# Patient Record
Sex: Female | Born: 1955
Health system: Southern US, Community
[De-identification: ages and names within clinical notes are randomized; demographics above are authoritative.]

## PROBLEM LIST (undated history)

## (undated) HISTORY — PX: ABDOMINAL HYSTERECTOMY: SHX81

---

## 2004-08-11 ENCOUNTER — Ambulatory Visit (HOSPITAL_COMMUNITY): Admission: RE | Admit: 2004-08-11 | Discharge: 2004-08-11 | Payer: Self-pay | Admitting: Emergency Medicine

## 2005-10-02 ENCOUNTER — Ambulatory Visit (HOSPITAL_COMMUNITY): Admission: RE | Admit: 2005-10-02 | Discharge: 2005-10-02 | Payer: Self-pay | Admitting: Emergency Medicine

## 2006-10-24 ENCOUNTER — Ambulatory Visit (HOSPITAL_COMMUNITY): Admission: RE | Admit: 2006-10-24 | Discharge: 2006-10-24 | Payer: Self-pay | Admitting: Family Medicine

## 2008-05-10 ENCOUNTER — Ambulatory Visit (HOSPITAL_COMMUNITY): Admission: RE | Admit: 2008-05-10 | Discharge: 2008-05-10 | Payer: Self-pay | Admitting: Family Medicine

## 2013-11-16 ENCOUNTER — Other Ambulatory Visit (HOSPITAL_COMMUNITY): Payer: Self-pay | Admitting: Pediatrics

## 2013-11-16 DIAGNOSIS — Z139 Encounter for screening, unspecified: Secondary | ICD-10-CM

## 2013-11-17 ENCOUNTER — Ambulatory Visit (HOSPITAL_COMMUNITY)
Admission: RE | Admit: 2013-11-17 | Discharge: 2013-11-17 | Disposition: A | Discharge status: Home / Self Care | Payer: 59 | Source: Ambulatory Visit | Attending: Pediatrics | Admitting: Pediatrics

## 2013-11-17 DIAGNOSIS — Z1231 Encounter for screening mammogram for malignant neoplasm of breast: Secondary | ICD-10-CM | POA: Insufficient documentation

## 2013-11-17 DIAGNOSIS — Z139 Encounter for screening, unspecified: Secondary | ICD-10-CM

## 2014-12-15 IMAGING — MG MM DIGITAL SCREENING
4 series · 4 of 4 positions shown · non-contrast
Comparison: Previous exam(s).

CLINICAL DATA: Screening.

EXAM:
DIGITAL SCREENING BILATERAL MAMMOGRAM WITH CAD

[L CC]
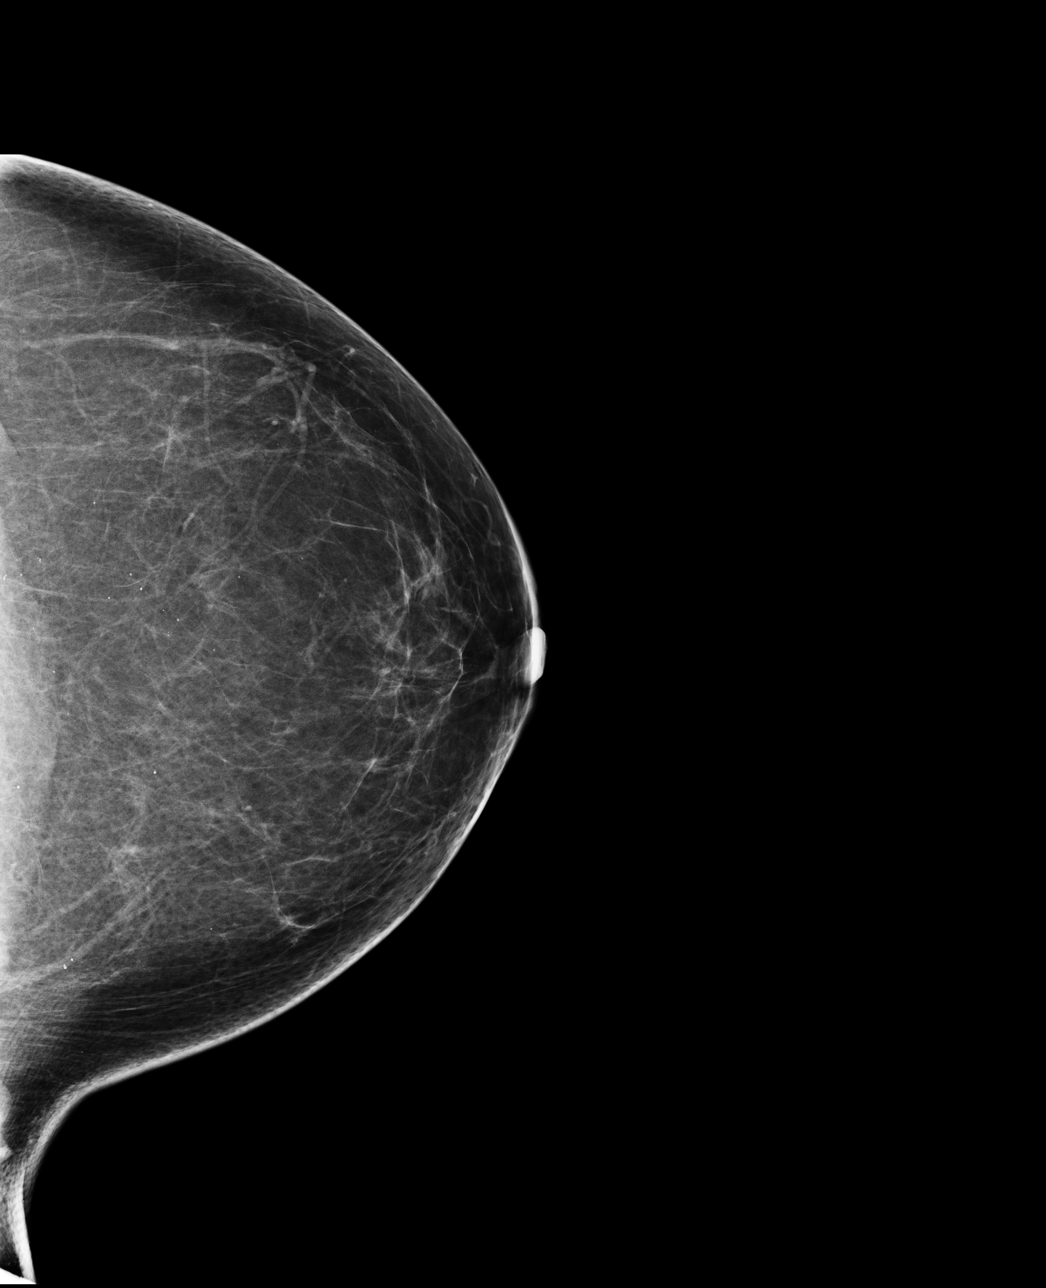

[L MLO]
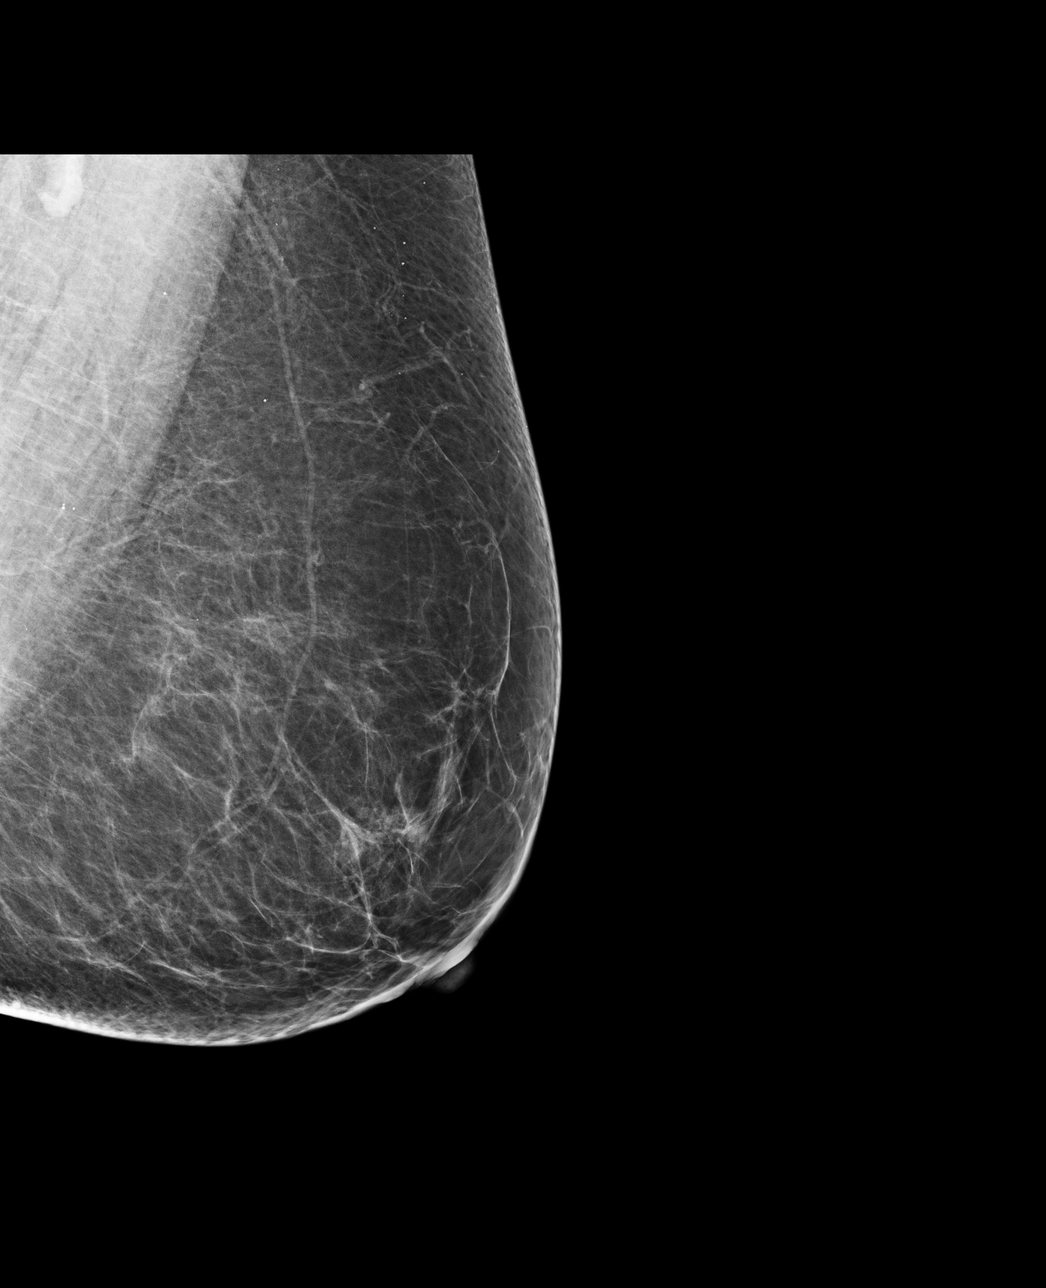

[R CC]
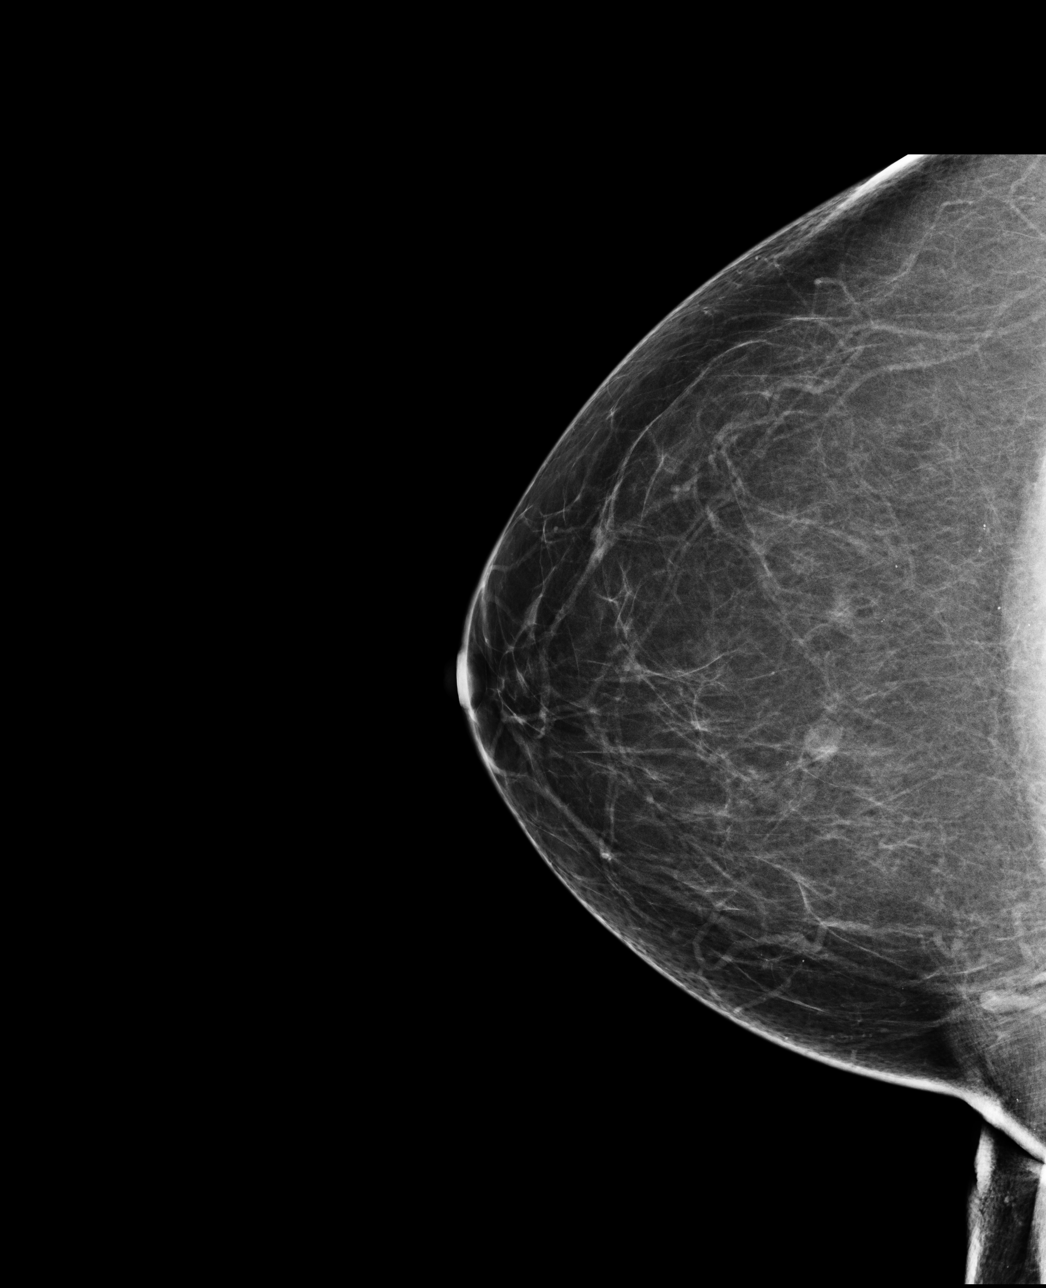

[R MLO]
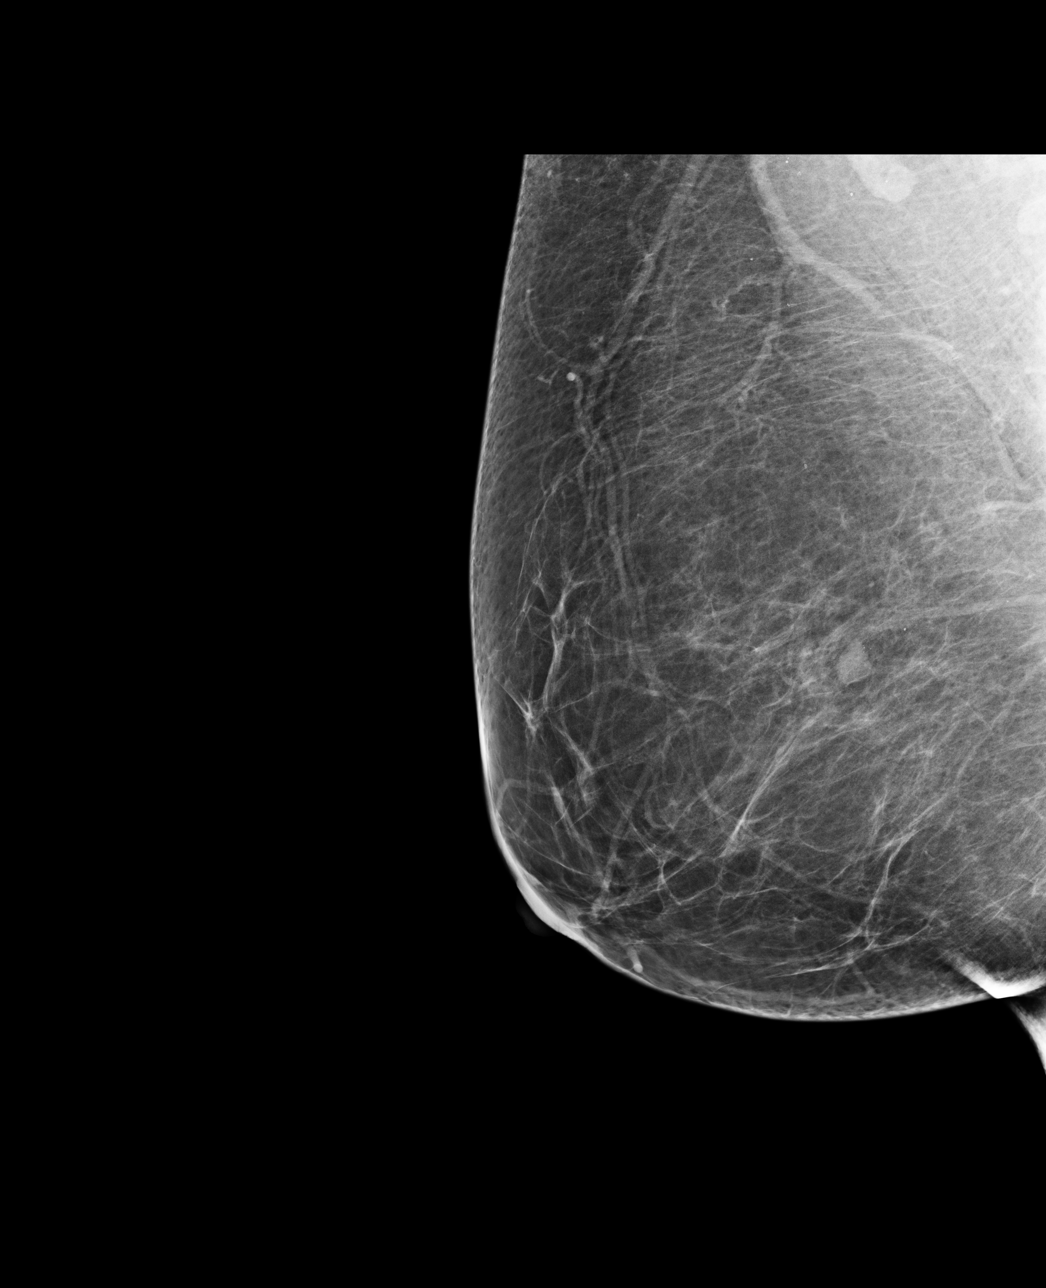

[4 of 4 positions shown; findings below may reference images not displayed]

ACR Breast Density Category b: There are scattered areas of
fibroglandular density.
FINDINGS: There are no findings suspicious for malignancy. Images were
processed with CAD.
IMPRESSION: No mammographic evidence of malignancy. A result letter of this
screening mammogram will be mailed directly to the patient.

RECOMMENDATION:
Screening mammogram in one year. (Code:AS-G-LCT)

BI-RADS CATEGORY  1: Negative.

## 2016-04-02 DIAGNOSIS — Z0100 Encounter for examination of eyes and vision without abnormal findings: Secondary | ICD-10-CM | POA: Diagnosis not present

## 2016-09-13 ENCOUNTER — Emergency Department
Admission: EM | Admit: 2016-09-13 | Discharge: 2016-09-13 | Disposition: A | Discharge status: Home / Self Care | Payer: 59 | Source: Home / Self Care | Attending: Family Medicine | Admitting: Family Medicine

## 2016-09-13 ENCOUNTER — Encounter: Payer: Self-pay | Admitting: *Deleted

## 2016-09-13 DIAGNOSIS — R300 Dysuria: Secondary | ICD-10-CM | POA: Diagnosis not present

## 2016-09-13 DIAGNOSIS — N3090 Cystitis, unspecified without hematuria: Secondary | ICD-10-CM

## 2016-09-13 LAB — POCT URINALYSIS DIP (MANUAL ENTRY)
Bilirubin, UA: NEGATIVE
Blood, UA: NEGATIVE
Glucose, UA: NEGATIVE
Ketones, POC UA: NEGATIVE
Leukocytes, UA: NEGATIVE
Nitrite, UA: NEGATIVE
Protein Ur, POC: NEGATIVE
Spec Grav, UA: 1.01 (ref 1.005–1.03)
Urobilinogen, UA: 0.2 (ref 0–1)
pH, UA: 7 (ref 5–8)

## 2016-09-13 MED ORDER — NITROFURANTOIN MONOHYD MACRO 100 MG PO CAPS: 100.0000 mg | ORAL_CAPSULE | Freq: Two times a day (BID) | ORAL | 0 refills | Status: AC

## 2016-09-13 NOTE — Discharge Instructions (Signed)
Increase fluid intake. May use non-prescription AZO for about two days, if desired, to decrease urinary discomfort.  

## 2016-09-13 NOTE — ED Triage Notes (Signed)
Pt c/o 3 days of dysuria, polyuria and bladder pressure.Afebrile, no otc meds taken.

## 2016-09-13 NOTE — ED Provider Notes (Signed)
[facility]    CSN: [id number] Arrival date & time: 09/13/16  1253     History   Chief Complaint Chief Complaint  Patient presents with  . Dysuria  . bladder pressure    HPI Maria Nguyen is a 60 y.o. female.   Patient complains of 3 to 4 day history of dysuria, urgency, nocturia, and lower abdominal pressure.   The history is provided by the patient.  Dysuria  Pain quality:  Burning Pain severity:  Mild Onset quality:  Gradual Duration:  3 days Timing:  Constant Progression:  Worsening Chronicity:  New Recent urinary tract infections: no   Relieved by:  Nothing Worsened by:  Nothing Ineffective treatments:  None tried Urinary symptoms: frequent urination and hesitancy   Urinary symptoms: no discolored urine, no foul-smelling urine, no hematuria and no bladder incontinence   Associated symptoms: no abdominal pain, no fever, no flank pain, no genital lesions, no nausea, no vaginal discharge and no vomiting     No past medical history on file.  There are no active problems to display for this patient.   Past Surgical History:  Procedure Laterality Date  . ABDOMINAL HYSTERECTOMY      OB History    No data available       Home Medications    Prior to Admission medications   Medication Sig Start Date End Date Taking? Authorizing Provider  aspirin EC 81 MG tablet Take 81 mg by mouth daily.   Yes Historical Provider, MD  nitrofurantoin, macrocrystal-monohydrate, (MACROBID) 100 MG capsule Take 1 capsule (100 mg total) by mouth 2 (two) times daily. Take with food. 09/13/16   Kandra Nicolas, MD    Family History No family history on file.  Social History Social History  Substance Use Topics  . Smoking status: Not on file  . Smokeless tobacco: Not on file  . Alcohol use Not on file     Allergies   Patient has no known allergies.   Review of Systems Review of Systems  Constitutional: Negative for fever.  Gastrointestinal:  Negative for abdominal pain, nausea and vomiting.  Genitourinary: Positive for dysuria. Negative for flank pain and vaginal discharge.     Physical Exam Triage Vital Signs ED Triage Vitals  Enc Vitals Group     BP 09/13/16 1323 115/71     Pulse Rate 09/13/16 1323 100     Resp --      Temp 09/13/16 1323 98.1 F (36.7 C)     Temp Source 09/13/16 1323 Oral     SpO2 09/13/16 1323 96 %     Weight 09/13/16 1323 188 lb (85.3 kg)     Height --      Head Circumference --      Peak Flow --      Pain Score 09/13/16 1324 0     Pain Loc --      Pain Edu? --      Excl. in [place]? --    No data found.   Updated Vital Signs BP 115/71 (BP Location: Left Arm)   Pulse 100   Temp 98.1 F (36.7 C) (Oral)   Wt 188 lb (85.3 kg)   SpO2 96%   Visual Acuity Right Eye Distance:   Left Eye Distance:   Bilateral Distance:    Right Eye Near:   Left Eye Near:    Bilateral Near:     Physical Exam Nursing notes and Vital Signs reviewed. Appearance:  Patient appears  stated age, and in no acute distress.    Eyes:  Pupils are equal, round, and reactive to light and accomodation.  Extraocular movement is intact.  Conjunctivae are not inflamed   Pharynx:  Normal; moist mucous membranes  Neck:  Supple.  No adenopathy Lungs:  Clear to auscultation.  Breath sounds are equal.  Moving air well. Heart:  Regular rate and rhythm without murmurs, rubs, or gallops.  Abdomen:  Nontender without masses or hepatosplenomegaly.  Bowel sounds are present.  No CVA or flank tenderness.  Extremities:  No edema.  Skin:  No rash present.     UC Treatments / Results  Labs (all labs ordered are listed, but only abnormal results are displayed) Labs Reviewed  URINE CULTURE   Narrative:    Performed at:  [facility], [id number]                [place], [place] [address]  POCT URINALYSIS DIP (MANUAL ENTRY) negative    EKG  EKG Interpretation None       Radiology No results  found.  Procedures Procedures (including critical care time)  Medications Ordered in UC Medications - No data to display   Initial Impression / Assessment and Plan / UC Course  I have reviewed the triage vital signs and the nursing notes.  Pertinent labs & imaging results that were available during my care of the patient were reviewed by me and considered in my medical decision making (see chart for details).  Clinical Course   Urine culture pending. Begin Macrobid 100mg  BID for one week. Increase fluid intake. If symptoms become significantly worse during the night or over the weekend, proceed to the local emergency room.  Followup with Family Doctor if not improved in one week.      Final Clinical Impressions(s) / UC Diagnoses   Final diagnoses:  Dysuria  Cystitis    New Prescriptions Discharge Medication List as of 09/13/2016  1:55 PM    START taking these medications   Details  nitrofurantoin, macrocrystal-monohydrate, (MACROBID) 100 MG capsule Take 1 capsule (100 mg total) by mouth 2 (two) times daily. Take with food., Starting Thu 09/13/2016, Print         Kandra Nicolas, MD 09/20/16 2118

## 2016-09-14 LAB — URINE CULTURE

## 2016-09-15 ENCOUNTER — Telehealth: Payer: Self-pay | Admitting: Emergency Medicine

## 2016-09-15 NOTE — Telephone Encounter (Signed)
Pt informed of results.  [place]

## 2018-01-21 DIAGNOSIS — Z1231 Encounter for screening mammogram for malignant neoplasm of breast: Secondary | ICD-10-CM | POA: Diagnosis not present

## 2018-01-21 DIAGNOSIS — Z131 Encounter for screening for diabetes mellitus: Secondary | ICD-10-CM | POA: Diagnosis not present

## 2018-01-21 DIAGNOSIS — Z13220 Encounter for screening for lipoid disorders: Secondary | ICD-10-CM | POA: Diagnosis not present

## 2018-01-21 DIAGNOSIS — Z23 Encounter for immunization: Secondary | ICD-10-CM | POA: Diagnosis not present

## 2018-01-21 DIAGNOSIS — Z0000 Encounter for general adult medical examination without abnormal findings: Secondary | ICD-10-CM | POA: Diagnosis not present

## 2018-01-22 ENCOUNTER — Other Ambulatory Visit (HOSPITAL_COMMUNITY): Payer: Self-pay | Admitting: Physician Assistant

## 2018-01-22 DIAGNOSIS — Z1231 Encounter for screening mammogram for malignant neoplasm of breast: Secondary | ICD-10-CM

## 2018-01-29 DIAGNOSIS — Z124 Encounter for screening for malignant neoplasm of cervix: Secondary | ICD-10-CM | POA: Diagnosis not present

## 2018-01-29 DIAGNOSIS — Z8639 Personal history of other endocrine, nutritional and metabolic disease: Secondary | ICD-10-CM | POA: Diagnosis not present

## 2018-01-29 DIAGNOSIS — E559 Vitamin D deficiency, unspecified: Secondary | ICD-10-CM | POA: Diagnosis not present

## 2018-01-29 DIAGNOSIS — Z0000 Encounter for general adult medical examination without abnormal findings: Secondary | ICD-10-CM | POA: Diagnosis not present

## 2018-01-29 DIAGNOSIS — R5383 Other fatigue: Secondary | ICD-10-CM | POA: Diagnosis not present

## 2018-02-20 ENCOUNTER — Encounter (HOSPITAL_COMMUNITY): Payer: Self-pay

## 2018-02-20 ENCOUNTER — Ambulatory Visit (HOSPITAL_COMMUNITY)
Admission: RE | Admit: 2018-02-20 | Discharge: 2018-02-20 | Disposition: A | Discharge status: Home / Self Care | Payer: 59 | Source: Ambulatory Visit | Attending: Physician Assistant | Admitting: Physician Assistant

## 2018-02-20 DIAGNOSIS — Z1231 Encounter for screening mammogram for malignant neoplasm of breast: Secondary | ICD-10-CM | POA: Insufficient documentation

## 2019-01-09 DIAGNOSIS — Z0100 Encounter for examination of eyes and vision without abnormal findings: Secondary | ICD-10-CM | POA: Diagnosis not present

## 2019-01-26 DIAGNOSIS — Z13220 Encounter for screening for lipoid disorders: Secondary | ICD-10-CM | POA: Diagnosis not present

## 2019-01-26 DIAGNOSIS — Z0000 Encounter for general adult medical examination without abnormal findings: Secondary | ICD-10-CM | POA: Diagnosis not present

## 2019-01-26 DIAGNOSIS — Z131 Encounter for screening for diabetes mellitus: Secondary | ICD-10-CM | POA: Diagnosis not present

## 2019-01-26 DIAGNOSIS — Z1239 Encounter for other screening for malignant neoplasm of breast: Secondary | ICD-10-CM | POA: Diagnosis not present

## 2019-02-12 DIAGNOSIS — L810 Postinflammatory hyperpigmentation: Secondary | ICD-10-CM | POA: Diagnosis not present

## 2019-05-14 DIAGNOSIS — L219 Seborrheic dermatitis, unspecified: Secondary | ICD-10-CM | POA: Diagnosis not present

## 2019-05-14 DIAGNOSIS — L821 Other seborrheic keratosis: Secondary | ICD-10-CM | POA: Diagnosis not present

## 2019-05-14 DIAGNOSIS — L570 Actinic keratosis: Secondary | ICD-10-CM | POA: Diagnosis not present

## 2019-05-14 DIAGNOSIS — D1801 Hemangioma of skin and subcutaneous tissue: Secondary | ICD-10-CM | POA: Diagnosis not present

## 2019-05-14 DIAGNOSIS — D2230 Melanocytic nevi of unspecified part of face: Secondary | ICD-10-CM | POA: Diagnosis not present

## 2019-05-14 DIAGNOSIS — L814 Other melanin hyperpigmentation: Secondary | ICD-10-CM | POA: Diagnosis not present

## 2019-05-14 DIAGNOSIS — D239 Other benign neoplasm of skin, unspecified: Secondary | ICD-10-CM | POA: Diagnosis not present

## 2019-05-14 DIAGNOSIS — D225 Melanocytic nevi of trunk: Secondary | ICD-10-CM | POA: Diagnosis not present

## 2019-07-14 ENCOUNTER — Other Ambulatory Visit (HOSPITAL_COMMUNITY): Payer: Self-pay | Admitting: Physician Assistant

## 2019-07-14 DIAGNOSIS — Z1231 Encounter for screening mammogram for malignant neoplasm of breast: Secondary | ICD-10-CM

## 2019-07-27 ENCOUNTER — Ambulatory Visit (HOSPITAL_COMMUNITY)
Admission: RE | Admit: 2019-07-27 | Discharge: 2019-07-27 | Disposition: A | Discharge status: Home / Self Care | Payer: 59 | Source: Ambulatory Visit | Attending: Physician Assistant | Admitting: Physician Assistant

## 2019-07-27 ENCOUNTER — Other Ambulatory Visit: Payer: Self-pay

## 2019-07-27 DIAGNOSIS — Z1231 Encounter for screening mammogram for malignant neoplasm of breast: Secondary | ICD-10-CM | POA: Insufficient documentation

## 2020-02-01 DIAGNOSIS — Z0000 Encounter for general adult medical examination without abnormal findings: Secondary | ICD-10-CM | POA: Diagnosis not present

## 2020-02-01 DIAGNOSIS — E559 Vitamin D deficiency, unspecified: Secondary | ICD-10-CM | POA: Diagnosis not present

## 2020-02-01 DIAGNOSIS — Z13220 Encounter for screening for lipoid disorders: Secondary | ICD-10-CM | POA: Diagnosis not present

## 2020-02-01 DIAGNOSIS — Z131 Encounter for screening for diabetes mellitus: Secondary | ICD-10-CM | POA: Diagnosis not present

## 2020-03-01 DIAGNOSIS — Z1159 Encounter for screening for other viral diseases: Secondary | ICD-10-CM | POA: Diagnosis not present

## 2020-03-30 DIAGNOSIS — Z1159 Encounter for screening for other viral diseases: Secondary | ICD-10-CM | POA: Diagnosis not present

## 2020-04-04 DIAGNOSIS — Z1211 Encounter for screening for malignant neoplasm of colon: Secondary | ICD-10-CM | POA: Diagnosis not present

## 2020-04-04 DIAGNOSIS — D122 Benign neoplasm of ascending colon: Secondary | ICD-10-CM | POA: Diagnosis not present

## 2020-04-04 DIAGNOSIS — D123 Benign neoplasm of transverse colon: Secondary | ICD-10-CM | POA: Diagnosis not present

## 2020-04-04 DIAGNOSIS — Z800 Family history of malignant neoplasm of digestive organs: Secondary | ICD-10-CM | POA: Diagnosis not present

## 2020-05-09 DIAGNOSIS — L814 Other melanin hyperpigmentation: Secondary | ICD-10-CM | POA: Diagnosis not present

## 2020-05-09 DIAGNOSIS — L723 Sebaceous cyst: Secondary | ICD-10-CM | POA: Diagnosis not present

## 2020-05-09 DIAGNOSIS — L578 Other skin changes due to chronic exposure to nonionizing radiation: Secondary | ICD-10-CM | POA: Diagnosis not present

## 2020-05-09 DIAGNOSIS — L570 Actinic keratosis: Secondary | ICD-10-CM | POA: Diagnosis not present

## 2020-05-09 DIAGNOSIS — L821 Other seborrheic keratosis: Secondary | ICD-10-CM | POA: Diagnosis not present

## 2020-05-09 DIAGNOSIS — D225 Melanocytic nevi of trunk: Secondary | ICD-10-CM | POA: Diagnosis not present

## 2020-05-09 DIAGNOSIS — D239 Other benign neoplasm of skin, unspecified: Secondary | ICD-10-CM | POA: Diagnosis not present

## 2020-09-07 ENCOUNTER — Other Ambulatory Visit (HOSPITAL_COMMUNITY): Payer: Self-pay | Admitting: Family Medicine

## 2020-09-07 DIAGNOSIS — Z1231 Encounter for screening mammogram for malignant neoplasm of breast: Secondary | ICD-10-CM

## 2020-10-26 ENCOUNTER — Encounter (HOSPITAL_COMMUNITY): Payer: Self-pay

## 2020-10-26 ENCOUNTER — Other Ambulatory Visit: Payer: Self-pay

## 2020-10-26 ENCOUNTER — Ambulatory Visit (HOSPITAL_COMMUNITY)
Admission: RE | Admit: 2020-10-26 | Discharge: 2020-10-26 | Disposition: A | Discharge status: Home / Self Care | Payer: 59 | Source: Ambulatory Visit | Attending: Family Medicine | Admitting: Family Medicine

## 2020-10-26 DIAGNOSIS — Z1231 Encounter for screening mammogram for malignant neoplasm of breast: Secondary | ICD-10-CM | POA: Insufficient documentation

## 2020-11-04 DIAGNOSIS — Z0100 Encounter for examination of eyes and vision without abnormal findings: Secondary | ICD-10-CM | POA: Diagnosis not present

## 2020-12-04 DIAGNOSIS — Z23 Encounter for immunization: Secondary | ICD-10-CM | POA: Diagnosis not present

## 2021-02-09 DIAGNOSIS — I10 Essential (primary) hypertension: Secondary | ICD-10-CM | POA: Diagnosis not present

## 2021-02-09 DIAGNOSIS — E559 Vitamin D deficiency, unspecified: Secondary | ICD-10-CM | POA: Diagnosis not present

## 2021-02-09 DIAGNOSIS — Z131 Encounter for screening for diabetes mellitus: Secondary | ICD-10-CM | POA: Diagnosis not present

## 2021-02-09 DIAGNOSIS — Z0000 Encounter for general adult medical examination without abnormal findings: Secondary | ICD-10-CM | POA: Diagnosis not present

## 2021-02-09 DIAGNOSIS — E785 Hyperlipidemia, unspecified: Secondary | ICD-10-CM | POA: Diagnosis not present

## 2021-03-10 ENCOUNTER — Other Ambulatory Visit (HOSPITAL_BASED_OUTPATIENT_CLINIC_OR_DEPARTMENT_OTHER): Payer: Self-pay

## 2021-03-10 MED ORDER — LISINOPRIL 10 MG PO TABS
ORAL_TABLET | ORAL | 2 refills | Status: DC
  Filled 2021-03-10: qty 90, 90d supply, fill #0
  Filled 2021-06-01: qty 60, 60d supply, fill #1

## 2021-03-10 MED ORDER — SIMVASTATIN 20 MG PO TABS
ORAL_TABLET | ORAL | 2 refills | Status: DC
  Filled 2021-03-10: qty 90, 90d supply, fill #0
  Filled 2021-06-01: qty 60, 60d supply, fill #1

## 2021-05-15 ENCOUNTER — Encounter: Payer: Self-pay | Admitting: Physician Assistant

## 2021-05-15 ENCOUNTER — Telehealth: Payer: 59 | Admitting: Physician Assistant

## 2021-05-15 DIAGNOSIS — U071 COVID-19: Secondary | ICD-10-CM | POA: Diagnosis not present

## 2021-05-15 MED ORDER — MOLNUPIRAVIR EUA 200MG CAPSULE: 4.0000 | ORAL_CAPSULE | Freq: Two times a day (BID) | ORAL | 0 refills | Status: AC

## 2021-05-15 MED ORDER — BENZONATATE 100 MG PO CAPS: 100.0000 mg | ORAL_CAPSULE | Freq: Three times a day (TID) | ORAL | 0 refills | Status: AC | PRN

## 2021-05-15 NOTE — Progress Notes (Signed)
Maria Nguyen are scheduled for a virtual visit with your provider today.    Just as we do with appointments in the office, we must obtain your consent to participate.  Your consent will be active for this visit and any virtual visit you may have with one of our providers in the next 365 days.    If you have a MyChart account, I can also send a copy of this consent to you electronically.  All virtual visits are billed to your insurance company just like a traditional visit in the office.  As this is a virtual visit, video technology does not allow for your provider to perform a traditional examination.  This may limit your provider's ability to fully assess your condition.  If your provider identifies any concerns that need to be evaluated in person or the need to arrange testing such as labs, EKG, etc, we will make arrangements to do so.    Although advances in technology are sophisticated, we cannot ensure that it will always work on either your end or our end.  If the connection with a video visit is poor, we may have to switch to a telephone visit.  With either a video or telephone visit, we are not always able to ensure that we have a secure connection.   I need to obtain your verbal consent now.   Are you willing to proceed with your visit today?   Maria Nguyen has provided verbal consent on 05/15/2021 for a virtual visit (video or telephone).   Mar Daring, PA-C 05/15/2021  12:41 PM  Virtual Visit Consent   Hendricks Nguyen, you are scheduled for a virtual visit with a [facility] provider today.     Just as with appointments in the office, your consent must be obtained to participate.  Your consent will be active for this visit and any virtual visit you may have with one of our providers in the next 365 days.     If you have a MyChart account, a copy of this consent can be sent to you electronically.  All virtual visits are billed to your insurance company just like a  traditional visit in the office.    As this is a virtual visit, video technology does not allow for your provider to perform a traditional examination.  This may limit your provider's ability to fully assess your condition.  If your provider identifies any concerns that need to be evaluated in person or the need to arrange testing (such as labs, EKG, etc.), we will make arrangements to do so.     Although advances in technology are sophisticated, we cannot ensure that it will always work on either your end or our end.  If the connection with a video visit is poor, the visit may have to be switched to a telephone visit.  With either a video or telephone visit, we are not always able to ensure that we have a secure connection.     I need to obtain your verbal consent now.   Are you willing to proceed with your visit today?    Maria Nguyen has provided verbal consent on 05/15/2021 for a virtual visit (video or telephone).   Mar Daring, PA-C   Date: 05/15/2021 12:41 PM   Virtual Visit via Video Note   I, Mar Daring, connected with  Maria Nguyen  ([id number], 31-Mar-1956) on 05/15/21 at 12:00 PM EDT by a video-enabled telemedicine application and  verified that I am speaking with the correct person using two identifiers.  Location: Patient: Virtual Visit Location Patient: Home Provider: Virtual Visit Location Provider: Home Office   I discussed the limitations of evaluation and management by telemedicine and the availability of in person appointments. The patient expressed understanding and agreed to proceed.    History of Present Illness: Maria Nguyen is a 65 y.o. who identifies as a female who was assigned female at birth, and is being seen today for Covid 19, this morning.  HPI: URI  This is a new problem. The current episode started yesterday. The problem has been unchanged. There has been no fever. Associated symptoms include congestion, coughing (dry),  headaches, a plugged ear sensation, rhinorrhea, sinus pain and a sore throat (more scratchy). Pertinent negatives include no diarrhea, ear pain, nausea, swollen glands or vomiting. Associated symptoms comments: Myalgias yesterday. Treatments tried: Mucinex, benadryl. The treatment provided mild relief.     Problems: There are no problems to display for this patient.   Allergies: No Known Allergies Medications:  Current Outpatient Medications:    benzonatate (TESSALON) 100 MG capsule, Take 1 capsule (100 mg total) by mouth 3 (three) times daily as needed., Disp: 30 capsule, Rfl: 0   molnupiravir EUA 200 mg CAPS, Take 4 capsules (800 mg total) by mouth 2 (two) times daily for 5 days., Disp: 40 capsule, Rfl: 0   aspirin EC 81 MG tablet, Take 81 mg by mouth daily., Disp: , Rfl:    lisinopril (ZESTRIL) 10 MG tablet, Take 1 tablet by mouth daily, Disp: 90 tablet, Rfl: 2   nitrofurantoin, macrocrystal-monohydrate, (MACROBID) 100 MG capsule, Take 1 capsule (100 mg total) by mouth 2 (two) times daily. Take with food., Disp: 14 capsule, Rfl: 0   simvastatin (ZOCOR) 20 MG tablet, Take 1 tablet by mouth every evening, Disp: 90 tablet, Rfl: 2  Observations/Objective: Patient is well-developed, well-nourished in no acute distress.  Resting comfortably at home.  Head is normocephalic, atraumatic.  No labored breathing. Dry, infrequent cough heard without effecting speech Speech is clear and coherent with logical content.  Patient is alert and oriented at baseline.    Assessment and Plan: 1. COVID-19 - molnupiravir EUA 200 mg CAPS; Take 4 capsules (800 mg total) by mouth 2 (two) times daily for 5 days.  Dispense: 40 capsule; Refill: 0 - benzonatate (TESSALON) 100 MG capsule; Take 1 capsule (100 mg total) by mouth 3 (three) times daily as needed.  Dispense: 30 capsule; Refill: 0 - MyChart COVID-19 home monitoring program; Future - Continue OTC symptomatic management of choice - Will send OTC vitamins  and supplement information through AVS - Molnupiravir prescribed for patient - Tessalon perles sent for cough - Patient enrolled in MyChart symptom monitoring - Push fluids - Rest as needed - Referral to Covid treatment team placed, consult appreciated - Discussed return precautions and when to seek in-person evaluation, sent via AVS as well   Follow Up Instructions: I discussed the assessment and treatment plan with the patient. The patient was provided an opportunity to ask questions and all were answered. The patient agreed with the plan and demonstrated an understanding of the instructions.  A copy of instructions were sent to the patient via MyChart.  The patient was advised to call back or seek an in-person evaluation if the symptoms worsen or if the condition fails to improve as anticipated.  Time:  I spent 12 minutes with the patient via telehealth technology discussing the above problems/concerns.  Mar Daring, PA-C

## 2021-05-15 NOTE — Patient Instructions (Addendum)
Hello Maria Nguyen,  You are being placed in the home monitoring program for COVID-19 (commonly known as Coronavirus).  This is because you are suspected to have the virus or are known to have the virus.  If you are unsure which group you fall into call your clinic.    As part of this program, you'll answer a daily questionnaire in the MyChart mobile app. You'll receive a notification through the MyChart app when the questionnaire is available. When you log in to MyChart, you'll see the tasks in your To Do activity.       Clinicians will see any answers that are concerning and take appropriate steps.  If at any point you are having a medical emergency, call 911.  If otherwise concerned call your clinic instead of coming into the clinic or hospital.  To keep from spreading the disease you should: Stay home and limit contact with other people as much as possible.  Wash your hands frequently. Cover your coughs and sneezes with a tissue, and throw used tissues in the trash.   Clean and disinfect frequently touched surfaces and objects.    Take care of yourself by: Staying home Resting Drinking fluids Take fever-reducing medications (Tylenol/Acetaminophen and Ibuprofen)  For more information on the disease go to the Centers for Disease Control and Prevention website     You are being prescribed MOLNUPIRAVIR for COVID-19 infection.   Please call the pharmacy or go through the drive through vs going inside if you are picking up the mediation yourself to prevent further spread. If prescribed to a [facility] affiliated pharmacy, a pharmacist will bring the medication out to your car.   ADMINISTRATION INSTRUCTIONS: Take with or without food. Swallow the tablets whole. Don't chew, crush, or break the medications because it might not work as well  For each dose of the medication, you should be taking FOUR tablets at one time, TWICE a day   Finish your full five-day course of Molnupiravir even if  you feel better before you're done. Stopping this medication too early can make it less effective to prevent severe illness related to [place].    Molnupiravir is prescribed for YOU ONLY. Don't share it with others, even if they have similar symptoms as you. This medication might not be right for everyone.   Make sure to take steps to protect yourself and others while you're taking this medication in order to get well soon and to prevent others from getting sick with COVID-19.   **If you are of childbearing potential (any gender) - it is advised to not get pregnant while taking this medication and recommended that condoms are used for female partners the next 3 months after taking the medication out of extreme caution    COMMON SIDE EFFECTS: Diarrhea Nausea  Dizziness    If your COVID-19 symptoms get worse, get medical help right away. Call 911 if you experience symptoms such as worsening cough, trouble breathing, chest pain that doesn't go away, confusion, a hard time staying awake, and pale or blue-colored skin. This medication won't prevent all COVID-19 cases from getting worse.  Can take to lessen severity: Vit C 500mg  twice daily Quercertin 250-500mg  twice daily Zinc 75-100mg  daily Melatonin 3-6 mg at bedtime Vit D3 1000-2000 IU daily Aspirin 81 mg daily with food Optional: Famotidine 20mg  daily Also can add tylenol/ibuprofen as needed for fevers and body aches May add Mucinex or Mucinex DM as needed for cough/congestion  10 Things You Can Do to  Manage Your COVID-19 Symptoms at Home If you have possible or confirmed COVID-19 Stay home except to get medical care. Monitor your symptoms carefully. If your symptoms get worse, call your healthcare provider immediately. Get rest and stay hydrated. If you have a medical appointment, call the healthcare provider ahead of time and tell them that you have or may have COVID-19. For medical emergencies, call 911 and notify the dispatch  personnel that you have or may have COVID-19. Cover your cough and sneezes with a tissue or use the inside of your elbow. Wash your hands often with soap and water for at least 20 seconds or clean your hands with an alcohol-based hand sanitizer that contains at least 60% alcohol. As much as possible, stay in a specific room and away from other people in your home. Also, you should use a separate bathroom, if available. If you need to be around other people in or outside of the home, wear a mask. Avoid sharing personal items with other people in your household, like dishes, towels, and bedding. Clean all surfaces that are touched often, like counters, tabletops, and doorknobs. Use household cleaning sprays or wipes according to the label instructions. [email or web address] 05/13/2020 This information is not intended to replace advice given to you by your health care provider. Make sure you discuss any questions you have with your healthcare provider. Document Revised: 12/02/2020 Document Reviewed: 12/02/2020 Elsevier Patient Education  [place].

## 2021-05-29 DIAGNOSIS — I10 Essential (primary) hypertension: Secondary | ICD-10-CM | POA: Diagnosis not present

## 2021-05-29 DIAGNOSIS — E785 Hyperlipidemia, unspecified: Secondary | ICD-10-CM | POA: Diagnosis not present

## 2021-05-29 DIAGNOSIS — Z131 Encounter for screening for diabetes mellitus: Secondary | ICD-10-CM | POA: Diagnosis not present

## 2021-05-29 DIAGNOSIS — Z0000 Encounter for general adult medical examination without abnormal findings: Secondary | ICD-10-CM | POA: Diagnosis not present

## 2021-05-29 DIAGNOSIS — E559 Vitamin D deficiency, unspecified: Secondary | ICD-10-CM | POA: Diagnosis not present

## 2021-06-01 ENCOUNTER — Other Ambulatory Visit (HOSPITAL_BASED_OUTPATIENT_CLINIC_OR_DEPARTMENT_OTHER): Payer: Self-pay

## 2021-06-05 DIAGNOSIS — L814 Other melanin hyperpigmentation: Secondary | ICD-10-CM | POA: Diagnosis not present

## 2021-06-05 DIAGNOSIS — D225 Melanocytic nevi of trunk: Secondary | ICD-10-CM | POA: Diagnosis not present

## 2021-06-05 DIAGNOSIS — L821 Other seborrheic keratosis: Secondary | ICD-10-CM | POA: Diagnosis not present

## 2021-06-05 DIAGNOSIS — D239 Other benign neoplasm of skin, unspecified: Secondary | ICD-10-CM | POA: Diagnosis not present

## 2021-06-05 DIAGNOSIS — L578 Other skin changes due to chronic exposure to nonionizing radiation: Secondary | ICD-10-CM | POA: Diagnosis not present

## 2021-08-03 ENCOUNTER — Other Ambulatory Visit (HOSPITAL_BASED_OUTPATIENT_CLINIC_OR_DEPARTMENT_OTHER): Payer: Self-pay

## 2021-08-03 MED ORDER — SIMVASTATIN 20 MG PO TABS
ORAL_TABLET | ORAL | 1 refills | Status: DC
  Filled 2021-08-03: qty 90, 90d supply, fill #0
  Filled 2021-11-06: qty 90, 90d supply, fill #1

## 2021-08-03 MED ORDER — LISINOPRIL 10 MG PO TABS
ORAL_TABLET | ORAL | 1 refills | Status: DC
  Filled 2021-08-03: qty 90, 90d supply, fill #0
  Filled 2021-11-06: qty 90, 90d supply, fill #1

## 2021-08-29 DIAGNOSIS — Z23 Encounter for immunization: Secondary | ICD-10-CM | POA: Diagnosis not present

## 2021-11-06 ENCOUNTER — Other Ambulatory Visit (HOSPITAL_BASED_OUTPATIENT_CLINIC_OR_DEPARTMENT_OTHER): Payer: Self-pay

## 2021-11-06 ENCOUNTER — Other Ambulatory Visit (HOSPITAL_BASED_OUTPATIENT_CLINIC_OR_DEPARTMENT_OTHER): Payer: Self-pay | Admitting: Family Medicine

## 2021-11-06 DIAGNOSIS — Z1231 Encounter for screening mammogram for malignant neoplasm of breast: Secondary | ICD-10-CM

## 2021-11-08 ENCOUNTER — Other Ambulatory Visit (HOSPITAL_BASED_OUTPATIENT_CLINIC_OR_DEPARTMENT_OTHER): Payer: Self-pay | Admitting: Family Medicine

## 2021-11-08 ENCOUNTER — Other Ambulatory Visit: Payer: Self-pay

## 2021-11-08 ENCOUNTER — Encounter (HOSPITAL_BASED_OUTPATIENT_CLINIC_OR_DEPARTMENT_OTHER): Payer: Self-pay

## 2021-11-08 ENCOUNTER — Ambulatory Visit (HOSPITAL_BASED_OUTPATIENT_CLINIC_OR_DEPARTMENT_OTHER)
Admission: RE | Admit: 2021-11-08 | Discharge: 2021-11-08 | Disposition: A | Discharge status: Home / Self Care | Payer: 59 | Source: Ambulatory Visit | Attending: Family Medicine | Admitting: Family Medicine

## 2021-11-08 DIAGNOSIS — Z1231 Encounter for screening mammogram for malignant neoplasm of breast: Secondary | ICD-10-CM | POA: Diagnosis not present

## 2021-12-19 DIAGNOSIS — Z0100 Encounter for examination of eyes and vision without abnormal findings: Secondary | ICD-10-CM | POA: Diagnosis not present

## 2022-01-30 ENCOUNTER — Other Ambulatory Visit (HOSPITAL_BASED_OUTPATIENT_CLINIC_OR_DEPARTMENT_OTHER): Payer: Self-pay

## 2022-01-30 MED ORDER — LISINOPRIL 10 MG PO TABS
ORAL_TABLET | ORAL | 1 refills | Status: AC
  Filled 2022-01-30: qty 90, 90d supply, fill #0

## 2022-01-30 MED ORDER — SIMVASTATIN 20 MG PO TABS
ORAL_TABLET | ORAL | 1 refills | Status: DC
  Filled 2022-01-30: qty 90, 90d supply, fill #0
  Filled 2022-05-02: qty 90, 90d supply, fill #1

## 2022-02-09 ENCOUNTER — Other Ambulatory Visit (HOSPITAL_BASED_OUTPATIENT_CLINIC_OR_DEPARTMENT_OTHER): Payer: Self-pay

## 2022-02-09 DIAGNOSIS — I10 Essential (primary) hypertension: Secondary | ICD-10-CM | POA: Diagnosis not present

## 2022-02-09 DIAGNOSIS — T783XXA Angioneurotic edema, initial encounter: Secondary | ICD-10-CM | POA: Diagnosis not present

## 2022-02-12 ENCOUNTER — Other Ambulatory Visit (HOSPITAL_BASED_OUTPATIENT_CLINIC_OR_DEPARTMENT_OTHER): Payer: Self-pay

## 2022-02-12 MED ORDER — AMLODIPINE BESYLATE 5 MG PO TABS
ORAL_TABLET | ORAL | 3 refills | Status: AC
  Filled 2022-02-12: qty 90, 90d supply, fill #0
  Filled 2022-05-02: qty 90, 90d supply, fill #1

## 2022-02-20 DIAGNOSIS — Z23 Encounter for immunization: Secondary | ICD-10-CM | POA: Diagnosis not present

## 2022-02-20 DIAGNOSIS — Z13820 Encounter for screening for osteoporosis: Secondary | ICD-10-CM | POA: Diagnosis not present

## 2022-02-20 DIAGNOSIS — I10 Essential (primary) hypertension: Secondary | ICD-10-CM | POA: Diagnosis not present

## 2022-02-20 DIAGNOSIS — Z1231 Encounter for screening mammogram for malignant neoplasm of breast: Secondary | ICD-10-CM | POA: Diagnosis not present

## 2022-02-20 DIAGNOSIS — Z124 Encounter for screening for malignant neoplasm of cervix: Secondary | ICD-10-CM | POA: Diagnosis not present

## 2022-02-20 DIAGNOSIS — E785 Hyperlipidemia, unspecified: Secondary | ICD-10-CM | POA: Diagnosis not present

## 2022-02-20 DIAGNOSIS — N3941 Urge incontinence: Secondary | ICD-10-CM | POA: Diagnosis not present

## 2022-02-20 DIAGNOSIS — Z1211 Encounter for screening for malignant neoplasm of colon: Secondary | ICD-10-CM | POA: Diagnosis not present

## 2022-02-20 DIAGNOSIS — Z0000 Encounter for general adult medical examination without abnormal findings: Secondary | ICD-10-CM | POA: Diagnosis not present

## 2022-05-03 ENCOUNTER — Other Ambulatory Visit (HOSPITAL_BASED_OUTPATIENT_CLINIC_OR_DEPARTMENT_OTHER): Payer: Self-pay

## 2022-06-05 DIAGNOSIS — D239 Other benign neoplasm of skin, unspecified: Secondary | ICD-10-CM | POA: Diagnosis not present

## 2022-06-05 DIAGNOSIS — L821 Other seborrheic keratosis: Secondary | ICD-10-CM | POA: Diagnosis not present

## 2022-06-05 DIAGNOSIS — L820 Inflamed seborrheic keratosis: Secondary | ICD-10-CM | POA: Diagnosis not present

## 2022-06-05 DIAGNOSIS — L578 Other skin changes due to chronic exposure to nonionizing radiation: Secondary | ICD-10-CM | POA: Diagnosis not present

## 2022-06-05 DIAGNOSIS — D229 Melanocytic nevi, unspecified: Secondary | ICD-10-CM | POA: Diagnosis not present

## 2022-06-05 DIAGNOSIS — L814 Other melanin hyperpigmentation: Secondary | ICD-10-CM | POA: Diagnosis not present

## 2022-07-30 ENCOUNTER — Other Ambulatory Visit (HOSPITAL_BASED_OUTPATIENT_CLINIC_OR_DEPARTMENT_OTHER): Payer: Self-pay

## 2022-07-30 MED ORDER — AMLODIPINE BESYLATE 5 MG PO TABS
5.0000 mg | ORAL_TABLET | Freq: Every day | ORAL | 2 refills | Status: DC
  Filled 2022-07-30: qty 90, 90d supply, fill #0
  Filled 2022-10-23: qty 90, 90d supply, fill #1
  Filled 2023-01-22: qty 90, 90d supply, fill #2

## 2022-07-30 MED ORDER — SIMVASTATIN 20 MG PO TABS
20.0000 mg | ORAL_TABLET | Freq: Every evening | ORAL | 2 refills | Status: DC
  Filled 2022-07-30: qty 90, 90d supply, fill #0
  Filled 2022-10-23: qty 90, 90d supply, fill #1
  Filled 2023-01-22: qty 90, 90d supply, fill #2

## 2022-08-17 DIAGNOSIS — Z23 Encounter for immunization: Secondary | ICD-10-CM | POA: Diagnosis not present

## 2022-11-26 ENCOUNTER — Other Ambulatory Visit: Payer: Self-pay | Admitting: Family Medicine

## 2022-11-26 DIAGNOSIS — Z1231 Encounter for screening mammogram for malignant neoplasm of breast: Secondary | ICD-10-CM

## 2022-11-28 ENCOUNTER — Ambulatory Visit (INDEPENDENT_AMBULATORY_CARE_PROVIDER_SITE_OTHER): Payer: Commercial Managed Care - PPO

## 2022-11-28 DIAGNOSIS — Z1231 Encounter for screening mammogram for malignant neoplasm of breast: Secondary | ICD-10-CM | POA: Diagnosis not present

## 2023-01-22 ENCOUNTER — Other Ambulatory Visit (HOSPITAL_BASED_OUTPATIENT_CLINIC_OR_DEPARTMENT_OTHER): Payer: Self-pay

## 2023-02-26 ENCOUNTER — Other Ambulatory Visit (HOSPITAL_BASED_OUTPATIENT_CLINIC_OR_DEPARTMENT_OTHER): Payer: Self-pay

## 2023-02-26 DIAGNOSIS — E785 Hyperlipidemia, unspecified: Secondary | ICD-10-CM | POA: Diagnosis not present

## 2023-02-26 DIAGNOSIS — Z13820 Encounter for screening for osteoporosis: Secondary | ICD-10-CM | POA: Diagnosis not present

## 2023-02-26 DIAGNOSIS — Z1211 Encounter for screening for malignant neoplasm of colon: Secondary | ICD-10-CM | POA: Diagnosis not present

## 2023-02-26 DIAGNOSIS — R7301 Impaired fasting glucose: Secondary | ICD-10-CM | POA: Diagnosis not present

## 2023-02-26 DIAGNOSIS — Z1231 Encounter for screening mammogram for malignant neoplasm of breast: Secondary | ICD-10-CM | POA: Diagnosis not present

## 2023-02-26 DIAGNOSIS — Z6838 Body mass index (BMI) 38.0-38.9, adult: Secondary | ICD-10-CM | POA: Diagnosis not present

## 2023-02-26 DIAGNOSIS — I10 Essential (primary) hypertension: Secondary | ICD-10-CM | POA: Diagnosis not present

## 2023-02-26 DIAGNOSIS — Z23 Encounter for immunization: Secondary | ICD-10-CM | POA: Diagnosis not present

## 2023-02-26 DIAGNOSIS — Z7185 Encounter for immunization safety counseling: Secondary | ICD-10-CM | POA: Diagnosis not present

## 2023-02-26 DIAGNOSIS — Z0000 Encounter for general adult medical examination without abnormal findings: Secondary | ICD-10-CM | POA: Diagnosis not present

## 2023-02-26 MED ORDER — SIMVASTATIN 20 MG PO TABS
20.0000 mg | ORAL_TABLET | Freq: Every evening | ORAL | 3 refills | Status: DC
  Filled 2023-02-26 – 2023-04-21 (×3): qty 90, 90d supply, fill #0
  Filled 2023-07-18: qty 90, 90d supply, fill #1
  Filled 2023-10-16: qty 90, 90d supply, fill #2
  Filled 2024-01-14: qty 90, 90d supply, fill #3

## 2023-02-26 MED ORDER — AMLODIPINE BESYLATE 5 MG PO TABS
5.0000 mg | ORAL_TABLET | Freq: Every day | ORAL | 3 refills | Status: DC
  Filled 2023-02-26 – 2023-04-21 (×3): qty 90, 90d supply, fill #0
  Filled 2023-07-18: qty 90, 90d supply, fill #1
  Filled 2023-10-16: qty 90, 90d supply, fill #2
  Filled 2024-01-14: qty 90, 90d supply, fill #3

## 2023-02-27 ENCOUNTER — Other Ambulatory Visit: Payer: Self-pay | Admitting: Family Medicine

## 2023-02-27 DIAGNOSIS — Z13820 Encounter for screening for osteoporosis: Secondary | ICD-10-CM

## 2023-03-02 DIAGNOSIS — Z0100 Encounter for examination of eyes and vision without abnormal findings: Secondary | ICD-10-CM | POA: Diagnosis not present

## 2023-03-27 ENCOUNTER — Other Ambulatory Visit (HOSPITAL_BASED_OUTPATIENT_CLINIC_OR_DEPARTMENT_OTHER): Payer: Self-pay

## 2023-04-21 ENCOUNTER — Other Ambulatory Visit (HOSPITAL_BASED_OUTPATIENT_CLINIC_OR_DEPARTMENT_OTHER): Payer: Self-pay

## 2023-04-24 ENCOUNTER — Other Ambulatory Visit: Payer: Commercial Managed Care - PPO

## 2023-05-15 ENCOUNTER — Ambulatory Visit (INDEPENDENT_AMBULATORY_CARE_PROVIDER_SITE_OTHER): Payer: Commercial Managed Care - PPO

## 2023-05-15 DIAGNOSIS — Z13820 Encounter for screening for osteoporosis: Secondary | ICD-10-CM

## 2023-05-15 DIAGNOSIS — Z780 Asymptomatic menopausal state: Secondary | ICD-10-CM | POA: Diagnosis not present

## 2023-06-06 DIAGNOSIS — L570 Actinic keratosis: Secondary | ICD-10-CM | POA: Diagnosis not present

## 2023-06-06 DIAGNOSIS — L821 Other seborrheic keratosis: Secondary | ICD-10-CM | POA: Diagnosis not present

## 2023-06-06 DIAGNOSIS — D229 Melanocytic nevi, unspecified: Secondary | ICD-10-CM | POA: Diagnosis not present

## 2023-06-06 DIAGNOSIS — D239 Other benign neoplasm of skin, unspecified: Secondary | ICD-10-CM | POA: Diagnosis not present

## 2023-06-06 DIAGNOSIS — L814 Other melanin hyperpigmentation: Secondary | ICD-10-CM | POA: Diagnosis not present

## 2023-06-06 DIAGNOSIS — L578 Other skin changes due to chronic exposure to nonionizing radiation: Secondary | ICD-10-CM | POA: Diagnosis not present

## 2023-07-10 ENCOUNTER — Other Ambulatory Visit (HOSPITAL_BASED_OUTPATIENT_CLINIC_OR_DEPARTMENT_OTHER): Payer: Self-pay

## 2023-07-10 MED ORDER — PEG 3350-KCL-NA BICARB-NACL 420 G PO SOLR
ORAL | 0 refills | Status: AC
  Filled 2023-07-10: qty 4000, 1d supply, fill #0

## 2023-07-25 DIAGNOSIS — K5730 Diverticulosis of large intestine without perforation or abscess without bleeding: Secondary | ICD-10-CM | POA: Diagnosis not present

## 2023-07-25 DIAGNOSIS — Z09 Encounter for follow-up examination after completed treatment for conditions other than malignant neoplasm: Secondary | ICD-10-CM | POA: Diagnosis not present

## 2023-07-25 DIAGNOSIS — Z800 Family history of malignant neoplasm of digestive organs: Secondary | ICD-10-CM | POA: Diagnosis not present

## 2023-07-25 DIAGNOSIS — Z86010 Personal history of colonic polyps: Secondary | ICD-10-CM | POA: Diagnosis not present

## 2023-08-15 DIAGNOSIS — Z23 Encounter for immunization: Secondary | ICD-10-CM | POA: Diagnosis not present

## 2023-11-25 ENCOUNTER — Other Ambulatory Visit: Payer: Self-pay | Admitting: Family Medicine

## 2023-11-25 DIAGNOSIS — Z1231 Encounter for screening mammogram for malignant neoplasm of breast: Secondary | ICD-10-CM

## 2023-12-11 ENCOUNTER — Ambulatory Visit: Payer: Commercial Managed Care - PPO

## 2023-12-11 DIAGNOSIS — Z1231 Encounter for screening mammogram for malignant neoplasm of breast: Secondary | ICD-10-CM

## 2024-03-17 ENCOUNTER — Ambulatory Visit
Admission: EM | Admit: 2024-03-17 | Discharge: 2024-03-17 | Disposition: A | Discharge status: Home / Self Care | Attending: Family Medicine | Admitting: Family Medicine

## 2024-03-17 ENCOUNTER — Encounter: Payer: Self-pay | Admitting: Emergency Medicine

## 2024-03-17 DIAGNOSIS — J039 Acute tonsillitis, unspecified: Secondary | ICD-10-CM

## 2024-03-17 MED ORDER — AMOXICILLIN-POT CLAVULANATE 875-125 MG PO TABS: 1.0000 | ORAL_TABLET | Freq: Two times a day (BID) | ORAL | 0 refills | Status: AC

## 2024-03-17 NOTE — ED Provider Notes (Signed)
[facility]    CSN: [id number] Arrival date & time: 03/17/24  9811      History   Chief Complaint Chief Complaint  Patient presents with   Sore Throat    HPI Maria Nguyen is a 68 y.o. female.   HPI  Patient is here for sore throat for 3 days.  Getting worse over time.  She is a retired Engineer, civil (consulting).  She is concerned because the sore throat is predominantly on 1 side of her throat.  The right side has more swollen than the left.  More pain.  Swollen glands on that side.  Ear pain on that side.  She understands that a unilateral cellulitis or abscess of the tonsils may require medical care.  She is here for evaluation.  She has had minor coughing and nasal congestion.  No fever.  No known exposure to illness  History reviewed. No pertinent past medical history.  There are no active problems to display for this patient.   Past Surgical History:  Procedure Laterality Date   ABDOMINAL HYSTERECTOMY      OB History   No obstetric history on file.      Home Medications    Prior to Admission medications   Medication Sig Start Date End Date Taking? Authorizing Provider  amLODipine  (NORVASC ) 5 MG tablet Take 1 tablet (5 mg total) by mouth daily. 02/26/23  Yes   amoxicillin-clavulanate (AUGMENTIN) 875-125 MG tablet Take 1 tablet by mouth every 12 (twelve) hours. 03/17/24  Yes Stephany Ehrich, MD  aspirin EC 81 MG tablet Take 81 mg by mouth daily.   Yes [provider]  simvastatin  (ZOCOR ) 20 MG tablet Take 1 tablet (20 mg total) by mouth every evening. 02/26/23  Yes   amLODipine  (NORVASC ) 5 MG tablet Take 1 tablet by mouth once a day 02/12/22     benzonatate  (TESSALON ) 100 MG capsule Take 1 capsule (100 mg total) by mouth 3 (three) times daily as needed. 05/15/21   Angelia Kelp, PA-C  lisinopril  (ZESTRIL ) 10 MG tablet Take 1 tablet by mouth once a day 01/30/22     nitrofurantoin , macrocrystal-monohydrate, (MACROBID ) 100 MG capsule Take 1 capsule (100 mg  total) by mouth 2 (two) times daily. Take with food. 09/13/16   Leon Rajas, MD  polyethylene glycol-electrolytes (NULYTELY) 420 g solution Use as directed by provider. 07/08/23       Family History History reviewed. No pertinent family history.  Social History Social History   Tobacco Use   Smoking status: Never   Smokeless tobacco: Never  Vaping Use   Vaping status: Never Used  Substance Use Topics   Alcohol use: Yes   Drug use: Never     Allergies   Ace inhibitors   Review of Systems Review of Systems See HPI  Physical Exam Triage Vital Signs ED Triage Vitals  Encounter Vitals Group     BP 03/17/24 0858 (!) 154/95     Systolic BP Percentile --      Diastolic BP Percentile --      Pulse Rate 03/17/24 0858 95     Resp 03/17/24 0858 18     Temp 03/17/24 0858 99.2 F (37.3 C)     Temp Source 03/17/24 0858 Oral     SpO2 03/17/24 0858 93 %     Weight 03/17/24 0856 190 lb (86.2 kg)     Height 03/17/24 0856 5' (1.524 m)     Head Circumference --  Peak Flow --      Pain Score 03/17/24 0855 5     Pain Loc --      Pain Education --      Exclude from Growth Chart --    No data found.  Updated Vital Signs BP (!) 154/95 (BP Location: Right Arm)   Pulse 95   Temp 99.2 F (37.3 C) (Oral)   Resp 18   Ht 5' (1.524 m)   Wt 86.2 kg   SpO2 93%   BMI 37.11 kg/m       Physical Exam Constitutional:      General: She is not in acute distress.    Appearance: She is well-developed and normal weight. She is not ill-appearing.  HENT:     Head: Normocephalic and atraumatic.     Right Ear: Tympanic membrane normal.     Left Ear: Tympanic membrane normal.     Nose: No congestion or rhinorrhea.     Mouth/Throat:     Mouth: Mucous membranes are moist.     Pharynx: Pharyngeal swelling and posterior oropharyngeal erythema present. No uvula swelling.     Tonsils: No tonsillar exudate. 3+ on the right. 1+ on the left.  Eyes:     Conjunctiva/sclera: Conjunctivae  normal.     Pupils: Pupils are equal, round, and reactive to light.  Cardiovascular:     Rate and Rhythm: Normal rate.  Pulmonary:     Effort: Pulmonary effort is normal. No respiratory distress.  Abdominal:     General: There is no distension.     Palpations: Abdomen is soft.  Musculoskeletal:        General: Normal range of motion.     Cervical back: Normal range of motion.  Lymphadenopathy:     Cervical: Cervical adenopathy present.  Skin:    General: Skin is warm and dry.  Neurological:     Mental Status: She is alert.      UC Treatments / Results  Labs (all labs ordered are listed, but only abnormal results are displayed) Labs Reviewed - No data to display  EKG   Radiology No results found.  Procedures Procedures (including critical care time)  Medications Ordered in UC Medications - No data to display  Initial Impression / Assessment and Plan / UC Course  I have reviewed the triage vital signs and the nursing notes.  Pertinent labs & imaging results that were available during my care of the patient were reviewed by me and considered in my medical decision making (see chart for details).     The right tonsil is more more swollen than the left.  There is not swelling associated with the tonsillar pillar or soft palate.  Concern for abscess formation.  Discussed with the patient.  Will treat with antibiotics and observation.  She understands she may need ENT if this persists Final Clinical Impressions(s) / UC Diagnoses   Final diagnoses:  Tonsillitis   Discharge Instructions      Take the Augmentin 2 times a day Make sure you are drinking lots of fluids Tylenol or ibuprofen for pain See your doctor if not improving with antibiotics May need ENT referral if tonsil remains swollen  ED Prescriptions     Medication Sig Dispense Auth. Provider   amoxicillin-clavulanate (AUGMENTIN) 875-125 MG tablet Take 1 tablet by mouth every 12 (twelve) hours. 20 tablet  Stephany Ehrich, MD      PDMP not reviewed this encounter.   Stephany Ehrich, MD  03/17/24 1127  

## 2024-03-17 NOTE — ED Triage Notes (Signed)
 Patient c/o sore throat x 3 days w/right ear pain.  Patient has done salt water gargles, productive cough, denies any nasal drainage.  Patient has taken Ibuprofen and OTC decongestants.

## 2024-03-17 NOTE — Discharge Instructions (Signed)
 Take the Augmentin 2 times a day Make sure you are drinking lots of fluids Tylenol or ibuprofen for pain See your doctor if not improving with antibiotics May need ENT referral if tonsil remains swollen

## 2024-03-20 DIAGNOSIS — I10 Essential (primary) hypertension: Secondary | ICD-10-CM | POA: Diagnosis not present

## 2024-03-20 DIAGNOSIS — Z6837 Body mass index (BMI) 37.0-37.9, adult: Secondary | ICD-10-CM | POA: Diagnosis not present

## 2024-03-20 DIAGNOSIS — E785 Hyperlipidemia, unspecified: Secondary | ICD-10-CM | POA: Diagnosis not present

## 2024-03-20 DIAGNOSIS — Z8639 Personal history of other endocrine, nutritional and metabolic disease: Secondary | ICD-10-CM | POA: Diagnosis not present

## 2024-03-20 DIAGNOSIS — Z131 Encounter for screening for diabetes mellitus: Secondary | ICD-10-CM | POA: Diagnosis not present

## 2024-03-20 DIAGNOSIS — Z0000 Encounter for general adult medical examination without abnormal findings: Secondary | ICD-10-CM | POA: Diagnosis not present

## 2024-03-20 DIAGNOSIS — E66812 Obesity, class 2: Secondary | ICD-10-CM | POA: Diagnosis not present

## 2024-03-20 DIAGNOSIS — E559 Vitamin D deficiency, unspecified: Secondary | ICD-10-CM | POA: Diagnosis not present

## 2024-03-20 DIAGNOSIS — Z1331 Encounter for screening for depression: Secondary | ICD-10-CM | POA: Diagnosis not present

## 2024-04-11 ENCOUNTER — Other Ambulatory Visit (HOSPITAL_BASED_OUTPATIENT_CLINIC_OR_DEPARTMENT_OTHER): Payer: Self-pay

## 2024-04-13 ENCOUNTER — Other Ambulatory Visit: Payer: Self-pay

## 2024-04-13 ENCOUNTER — Other Ambulatory Visit (HOSPITAL_BASED_OUTPATIENT_CLINIC_OR_DEPARTMENT_OTHER): Payer: Self-pay

## 2024-04-13 MED ORDER — AMLODIPINE BESYLATE 5 MG PO TABS
5.0000 mg | ORAL_TABLET | Freq: Every day | ORAL | 3 refills | Status: AC
  Filled 2024-04-13 (×3): qty 90, 90d supply, fill #0
  Filled 2024-10-11: qty 90, 90d supply, fill #1

## 2024-04-13 MED ORDER — AMLODIPINE BESYLATE 5 MG PO TABS
5.0000 mg | ORAL_TABLET | Freq: Every day | ORAL | 3 refills | Status: AC
  Filled 2024-04-13 – 2024-07-16 (×3): qty 90, 90d supply, fill #0

## 2024-04-13 MED ORDER — SIMVASTATIN 20 MG PO TABS
20.0000 mg | ORAL_TABLET | Freq: Every evening | ORAL | 3 refills | Status: DC
  Filled 2024-04-13: qty 90, 90d supply, fill #0

## 2024-04-13 MED ORDER — SIMVASTATIN 20 MG PO TABS
20.0000 mg | ORAL_TABLET | Freq: Every evening | ORAL | 3 refills | Status: AC
  Filled 2024-04-13 (×2): qty 90, 90d supply, fill #0
  Filled 2024-07-16: qty 90, 90d supply, fill #1
  Filled 2024-10-11: qty 90, 90d supply, fill #2

## 2024-05-06 ENCOUNTER — Other Ambulatory Visit (HOSPITAL_BASED_OUTPATIENT_CLINIC_OR_DEPARTMENT_OTHER): Payer: Self-pay

## 2024-05-29 ENCOUNTER — Encounter

## 2024-05-29 ENCOUNTER — Telehealth: Admitting: Physician Assistant

## 2024-05-29 DIAGNOSIS — U071 COVID-19: Secondary | ICD-10-CM | POA: Diagnosis not present

## 2024-05-29 MED ORDER — MOLNUPIRAVIR EUA 200MG CAPSULE: 4.0000 | ORAL_CAPSULE | Freq: Two times a day (BID) | ORAL | 0 refills | Status: AC

## 2024-05-29 MED ORDER — NIRMATRELVIR/RITONAVIR (PAXLOVID)TABLET: 3.0000 | ORAL_TABLET | Freq: Two times a day (BID) | ORAL | 0 refills | Status: AC

## 2024-05-29 NOTE — Progress Notes (Signed)
 Virtual Visit Consent   Maria Nguyen, you are scheduled for a virtual visit with a [facility] provider today. Just as with appointments in the office, your consent must be obtained to participate. Your consent will be active for this visit and any virtual visit you may have with one of our providers in the next 365 days. If you have a MyChart account, a copy of this consent can be sent to you electronically.  As this is a virtual visit, video technology does not allow for your provider to perform a traditional examination. This may limit your provider's ability to fully assess your condition. If your provider identifies any concerns that need to be evaluated in person or the need to arrange testing (such as labs, EKG, etc.), we will make arrangements to do so. Although advances in technology are sophisticated, we cannot ensure that it will always work on either your end or our end. If the connection with a video visit is poor, the visit may have to be switched to a telephone visit. With either a video or telephone visit, we are not always able to ensure that we have a secure connection.  By engaging in this virtual visit, you consent to the provision of healthcare and authorize for your insurance to be billed (if applicable) for the services provided during this visit. Depending on your insurance coverage, you may receive a charge related to this service.  I need to obtain your verbal consent now. Are you willing to proceed with your visit today? Maria Nguyen has provided verbal consent on 05/29/2024 for a virtual visit (video or telephone). Delon CHRISTELLA Dickinson, PA-C  Date: 05/29/2024 8:57 AM   Virtual Visit via Video Note   I, Delon CHRISTELLA Dickinson, connected with  Maria Nguyen  ([id number], Oct 26, 1956) on 05/29/24 at  8:45 AM EDT by a video-enabled telemedicine application and verified that I am speaking with the correct person using two identifiers.  Location: Patient: Virtual Visit  Location Patient: Home Provider: Virtual Visit Location Provider: Home Office   I discussed the limitations of evaluation and management by telemedicine and the availability of in person appointments. The patient expressed understanding and agreed to proceed.    History of Present Illness: Maria Nguyen is a 68 y.o. who identifies as a female who was assigned female at birth, and is being seen today for Covid 58.  HPI: URI  This is a new problem. The current episode started yesterday (positive at home covid 19 testing). The problem has been gradually worsening. Maximum temperature: subjective fevers. Associated symptoms include congestion, headaches, a plugged ear sensation, rhinorrhea and sinus pain. Pertinent negatives include no coughing, diarrhea, ear pain, nausea, sore throat or vomiting. Associated symptoms comments: chills. She has tried acetaminophen for the symptoms. The treatment provided mild relief.     Problems: There are no active problems to display for this patient.   Allergies:  Allergies  Allergen Reactions   Ace Inhibitors Swelling    Other Reaction(s): Unknown   Medications:  Current Outpatient Medications:    molnupiravir  EUA (LAGEVRIO ) 200 mg CAPS capsule, Take 4 capsules (800 mg total) by mouth 2 (two) times daily for 5 days., Disp: 40 capsule, Rfl: 0   nirmatrelvir/ritonavir (PAXLOVID) 20 x 150 MG & 10 x 100MG  TABS, Take 3 tablets by mouth 2 (two) times daily for 5 days. (Take nirmatrelvir 150 mg two tablets twice daily for 5 days and ritonavir 100 mg one tablet twice daily for 5  days), Disp: 30 tablet, Rfl: 0   amLODipine  (NORVASC ) 5 MG tablet, Take 1 tablet by mouth once a day, Disp: 90 tablet, Rfl: 3   amLODipine  (NORVASC ) 5 MG tablet, Take 1 tablet (5 mg total) by mouth daily., Disp: 90 tablet, Rfl: 3   amLODipine  (NORVASC ) 5 MG tablet, Take 1 tablet (5 mg total) by mouth daily., Disp: 90 tablet, Rfl: 3   amoxicillin -clavulanate (AUGMENTIN ) 875-125 MG tablet,  Take 1 tablet by mouth every 12 (twelve) hours., Disp: 20 tablet, Rfl: 0   aspirin EC 81 MG tablet, Take 81 mg by mouth daily., Disp: , Rfl:    benzonatate  (TESSALON ) 100 MG capsule, Take 1 capsule (100 mg total) by mouth 3 (three) times daily as needed., Disp: 30 capsule, Rfl: 0   lisinopril  (ZESTRIL ) 10 MG tablet, Take 1 tablet by mouth once a day, Disp: 90 tablet, Rfl: 1   nitrofurantoin , macrocrystal-monohydrate, (MACROBID ) 100 MG capsule, Take 1 capsule (100 mg total) by mouth 2 (two) times daily. Take with food., Disp: 14 capsule, Rfl: 0   polyethylene glycol-electrolytes (NULYTELY) 420 g solution, Use as directed by provider., Disp: 4000 mL, Rfl: 0   simvastatin  (ZOCOR ) 20 MG tablet, Take 1 tablet (20 mg total) by mouth every evening., Disp: 90 tablet, Rfl: 3  Observations/Objective: Patient is well-developed, well-nourished in no acute distress.  Resting comfortably at home.  Head is normocephalic, atraumatic.  No labored breathing.  Speech is clear and coherent with logical content.  Patient is alert and oriented at baseline.    Assessment and Plan: 1. COVID-19 (Primary) - nirmatrelvir/ritonavir (PAXLOVID) 20 x 150 MG & 10 x 100MG  TABS; Take 3 tablets by mouth 2 (two) times daily for 5 days. (Take nirmatrelvir 150 mg two tablets twice daily for 5 days and ritonavir 100 mg one tablet twice daily for 5 days)  Dispense: 30 tablet; Refill: 0 - molnupiravir  EUA (LAGEVRIO ) 200 mg CAPS capsule; Take 4 capsules (800 mg total) by mouth 2 (two) times daily for 5 days.  Dispense: 40 capsule; Refill: 0  - Continue OTC symptomatic management of choice - Will send OTC vitamins and supplement information through AVS - Paxlovid prescribed; Molnupiravir  sent for cost management- can see which is cheaper - Push fluids - Rest as needed - Discussed return precautions and when to seek in-person evaluation, sent via AVS as well   Follow Up Instructions: I discussed the assessment and treatment plan  with the patient. The patient was provided an opportunity to ask questions and all were answered. The patient agreed with the plan and demonstrated an understanding of the instructions.  A copy of instructions were sent to the patient via MyChart unless otherwise noted below.    The patient was advised to call back or seek an in-person evaluation if the symptoms worsen or if the condition fails to improve as anticipated.    Delon CHRISTELLA Dickinson, PA-C

## 2024-05-29 NOTE — Patient Instructions (Signed)
 Nena GORMAN Camera, thank you for joining Delon CHRISTELLA Dickinson, PA-C for today's virtual visit.  While this provider is not your primary care provider (PCP), if your PCP is located in our provider database this encounter information will be shared with them immediately following your visit.   A [place] MyChart account gives you access to today's visit and all your visits, tests, and labs performed at [facility]  click here if you don't have a [facility] [place] MyChart account or go to [email or web address]  Consent: (Patient) Maria Nguyen provided verbal consent for this virtual visit at the beginning of the encounter.  Current Medications:  Current Outpatient Medications:    molnupiravir  EUA (LAGEVRIO ) 200 mg CAPS capsule, Take 4 capsules (800 mg total) by mouth 2 (two) times daily for 5 days., Disp: 40 capsule, Rfl: 0   nirmatrelvir/ritonavir (PAXLOVID) 20 x 150 MG & 10 x 100MG  TABS, Take 3 tablets by mouth 2 (two) times daily for 5 days. (Take nirmatrelvir 150 mg two tablets twice daily for 5 days and ritonavir 100 mg one tablet twice daily for 5 days), Disp: 30 tablet, Rfl: 0   amLODipine  (NORVASC ) 5 MG tablet, Take 1 tablet by mouth once a day, Disp: 90 tablet, Rfl: 3   amLODipine  (NORVASC ) 5 MG tablet, Take 1 tablet (5 mg total) by mouth daily., Disp: 90 tablet, Rfl: 3   amLODipine  (NORVASC ) 5 MG tablet, Take 1 tablet (5 mg total) by mouth daily., Disp: 90 tablet, Rfl: 3   amoxicillin -clavulanate (AUGMENTIN ) 875-125 MG tablet, Take 1 tablet by mouth every 12 (twelve) hours., Disp: 20 tablet, Rfl: 0   aspirin EC 81 MG tablet, Take 81 mg by mouth daily., Disp: , Rfl:    benzonatate  (TESSALON ) 100 MG capsule, Take 1 capsule (100 mg total) by mouth 3 (three) times daily as needed., Disp: 30 capsule, Rfl: 0   lisinopril  (ZESTRIL ) 10 MG tablet, Take 1 tablet by mouth once a day, Disp: 90 tablet, Rfl: 1   nitrofurantoin , macrocrystal-monohydrate, (MACROBID ) 100 MG capsule, Take 1  capsule (100 mg total) by mouth 2 (two) times daily. Take with food., Disp: 14 capsule, Rfl: 0   polyethylene glycol-electrolytes (NULYTELY) 420 g solution, Use as directed by provider., Disp: 4000 mL, Rfl: 0   simvastatin  (ZOCOR ) 20 MG tablet, Take 1 tablet (20 mg total) by mouth every evening., Disp: 90 tablet, Rfl: 3   Medications ordered in this encounter:  Meds ordered this encounter  Medications   nirmatrelvir/ritonavir (PAXLOVID) 20 x 150 MG & 10 x 100MG  TABS    Sig: Take 3 tablets by mouth 2 (two) times daily for 5 days. (Take nirmatrelvir 150 mg two tablets twice daily for 5 days and ritonavir 100 mg one tablet twice daily for 5 days)    Dispense:  30 tablet    Refill:  0    Supervising Provider:   BLAISE ALEENE KIDD [[id number]]   molnupiravir  EUA (LAGEVRIO ) 200 mg CAPS capsule    Sig: Take 4 capsules (800 mg total) by mouth 2 (two) times daily for 5 days.    Dispense:  40 capsule    Refill:  0    Supervising Provider:   BLAISE ALEENE KIDD [[id number]]     *If you need refills on other medications prior to your next appointment, please contact your pharmacy*  Follow-Up: Call back or seek an in-person evaluation if the symptoms worsen or if the condition fails to improve as anticipated.  [facility] [phone]  Other Instructions Can take to lessen severity: Vit C 500mg  twice daily Quercertin 250-500mg  twice daily Zinc 75-100mg  daily Melatonin 3-6 mg at bedtime Vit D3 1000-2000 IU daily Aspirin 81 mg daily with food Optional: Famotidine 20mg  daily Also can add tylenol/ibuprofen as needed for fevers and body aches May add Mucinex or Mucinex DM as needed for cough/congestion   If you have been instructed to have an in-person evaluation today at a local Urgent Care facility, please use the link below. It will take you to a list of all of our available [place] [facility], including address, phone number and hours of operation. Please do not delay care.   [facility]  If you or a family member do not have a primary care provider, use the link below to schedule a visit and establish care. When you choose a [facility] primary care physician or advanced practice provider, you gain a long-term partner in health. Find a Primary Care Provider  Learn more about [facility] in-office and virtual care options: [place] - Get Care Now

## 2024-07-10 DIAGNOSIS — H52203 Unspecified astigmatism, bilateral: Secondary | ICD-10-CM | POA: Diagnosis not present

## 2024-07-16 ENCOUNTER — Other Ambulatory Visit (HOSPITAL_BASED_OUTPATIENT_CLINIC_OR_DEPARTMENT_OTHER): Payer: Self-pay

## 2024-08-04 ENCOUNTER — Other Ambulatory Visit (HOSPITAL_COMMUNITY): Payer: Self-pay

## 2024-08-14 DIAGNOSIS — Z23 Encounter for immunization: Secondary | ICD-10-CM | POA: Diagnosis not present

## 2024-09-22 DIAGNOSIS — E669 Obesity, unspecified: Secondary | ICD-10-CM | POA: Diagnosis not present

## 2024-09-22 DIAGNOSIS — E6609 Other obesity due to excess calories: Secondary | ICD-10-CM | POA: Diagnosis not present

## 2024-09-22 DIAGNOSIS — Z6838 Body mass index (BMI) 38.0-38.9, adult: Secondary | ICD-10-CM | POA: Diagnosis not present

## 2024-09-22 DIAGNOSIS — E785 Hyperlipidemia, unspecified: Secondary | ICD-10-CM | POA: Diagnosis not present

## 2024-09-22 DIAGNOSIS — Z8639 Personal history of other endocrine, nutritional and metabolic disease: Secondary | ICD-10-CM | POA: Diagnosis not present

## 2024-09-22 DIAGNOSIS — E559 Vitamin D deficiency, unspecified: Secondary | ICD-10-CM | POA: Diagnosis not present

## 2024-09-22 DIAGNOSIS — I10 Essential (primary) hypertension: Secondary | ICD-10-CM | POA: Diagnosis not present

## 2024-10-12 ENCOUNTER — Encounter (HOSPITAL_BASED_OUTPATIENT_CLINIC_OR_DEPARTMENT_OTHER): Payer: Self-pay

## 2024-10-12 ENCOUNTER — Other Ambulatory Visit (HOSPITAL_BASED_OUTPATIENT_CLINIC_OR_DEPARTMENT_OTHER): Payer: Self-pay

## 2024-11-02 ENCOUNTER — Other Ambulatory Visit (HOSPITAL_BASED_OUTPATIENT_CLINIC_OR_DEPARTMENT_OTHER): Payer: Self-pay | Admitting: Family Medicine

## 2024-11-02 DIAGNOSIS — Z1231 Encounter for screening mammogram for malignant neoplasm of breast: Secondary | ICD-10-CM

## 2024-12-15 ENCOUNTER — Ambulatory Visit (HOSPITAL_BASED_OUTPATIENT_CLINIC_OR_DEPARTMENT_OTHER)
